# Patient Record
Sex: Male | Born: 1970 | Race: White | Hispanic: No | Marital: Married | Smoking: Never smoker
Health system: Southern US, Community
[De-identification: ages and names within clinical notes are randomized; demographics above are authoritative.]

## PROBLEM LIST (undated history)

## (undated) DIAGNOSIS — E7800 Pure hypercholesterolemia, unspecified: Secondary | ICD-10-CM

## (undated) DIAGNOSIS — E079 Disorder of thyroid, unspecified: Secondary | ICD-10-CM

## (undated) DIAGNOSIS — F419 Anxiety disorder, unspecified: Secondary | ICD-10-CM

## (undated) DIAGNOSIS — L80 Vitiligo: Secondary | ICD-10-CM

## (undated) DIAGNOSIS — D72819 Decreased white blood cell count, unspecified: Secondary | ICD-10-CM

## (undated) HISTORY — DX: Disorder of thyroid, unspecified: E07.9

## (undated) HISTORY — DX: Vitiligo: L80

## (undated) HISTORY — DX: Decreased white blood cell count, unspecified: D72.819

## (undated) HISTORY — DX: Pure hypercholesterolemia, unspecified: E78.00

## (undated) HISTORY — DX: Anxiety disorder, unspecified: F41.9

---

## 2002-06-19 ENCOUNTER — Ambulatory Visit (HOSPITAL_COMMUNITY): Admission: RE | Admit: 2002-06-19 | Discharge: 2002-06-19 | Payer: Self-pay | Admitting: Family Medicine

## 2002-06-19 ENCOUNTER — Encounter: Payer: Self-pay | Admitting: Family Medicine

## 2004-07-19 DIAGNOSIS — E7800 Pure hypercholesterolemia, unspecified: Secondary | ICD-10-CM

## 2004-07-19 HISTORY — DX: Pure hypercholesterolemia, unspecified: E78.00

## 2005-02-16 ENCOUNTER — Ambulatory Visit (HOSPITAL_COMMUNITY): Admission: RE | Admit: 2005-02-16 | Discharge: 2005-02-16 | Payer: Self-pay | Admitting: Family Medicine

## 2005-06-15 ENCOUNTER — Encounter: Admission: RE | Admit: 2005-06-15 | Discharge: 2005-09-13 | Payer: Self-pay | Admitting: Neurosurgery

## 2005-12-16 HISTORY — PX: BACK SURGERY: SHX140

## 2005-12-20 ENCOUNTER — Ambulatory Visit (HOSPITAL_COMMUNITY): Admission: RE | Admit: 2005-12-20 | Discharge: 2005-12-21 | Payer: Self-pay | Admitting: Neurosurgery

## 2005-12-20 IMAGING — CR DG LUMBAR SPINE 1V
1 series · 1 of 1 positions shown · non-contrast
Comparison: none

CLINICAL DATA: L5-S1 microdiscectomy. 
 LUMBAR SPINE - 1 VIEW:

[view not recorded]
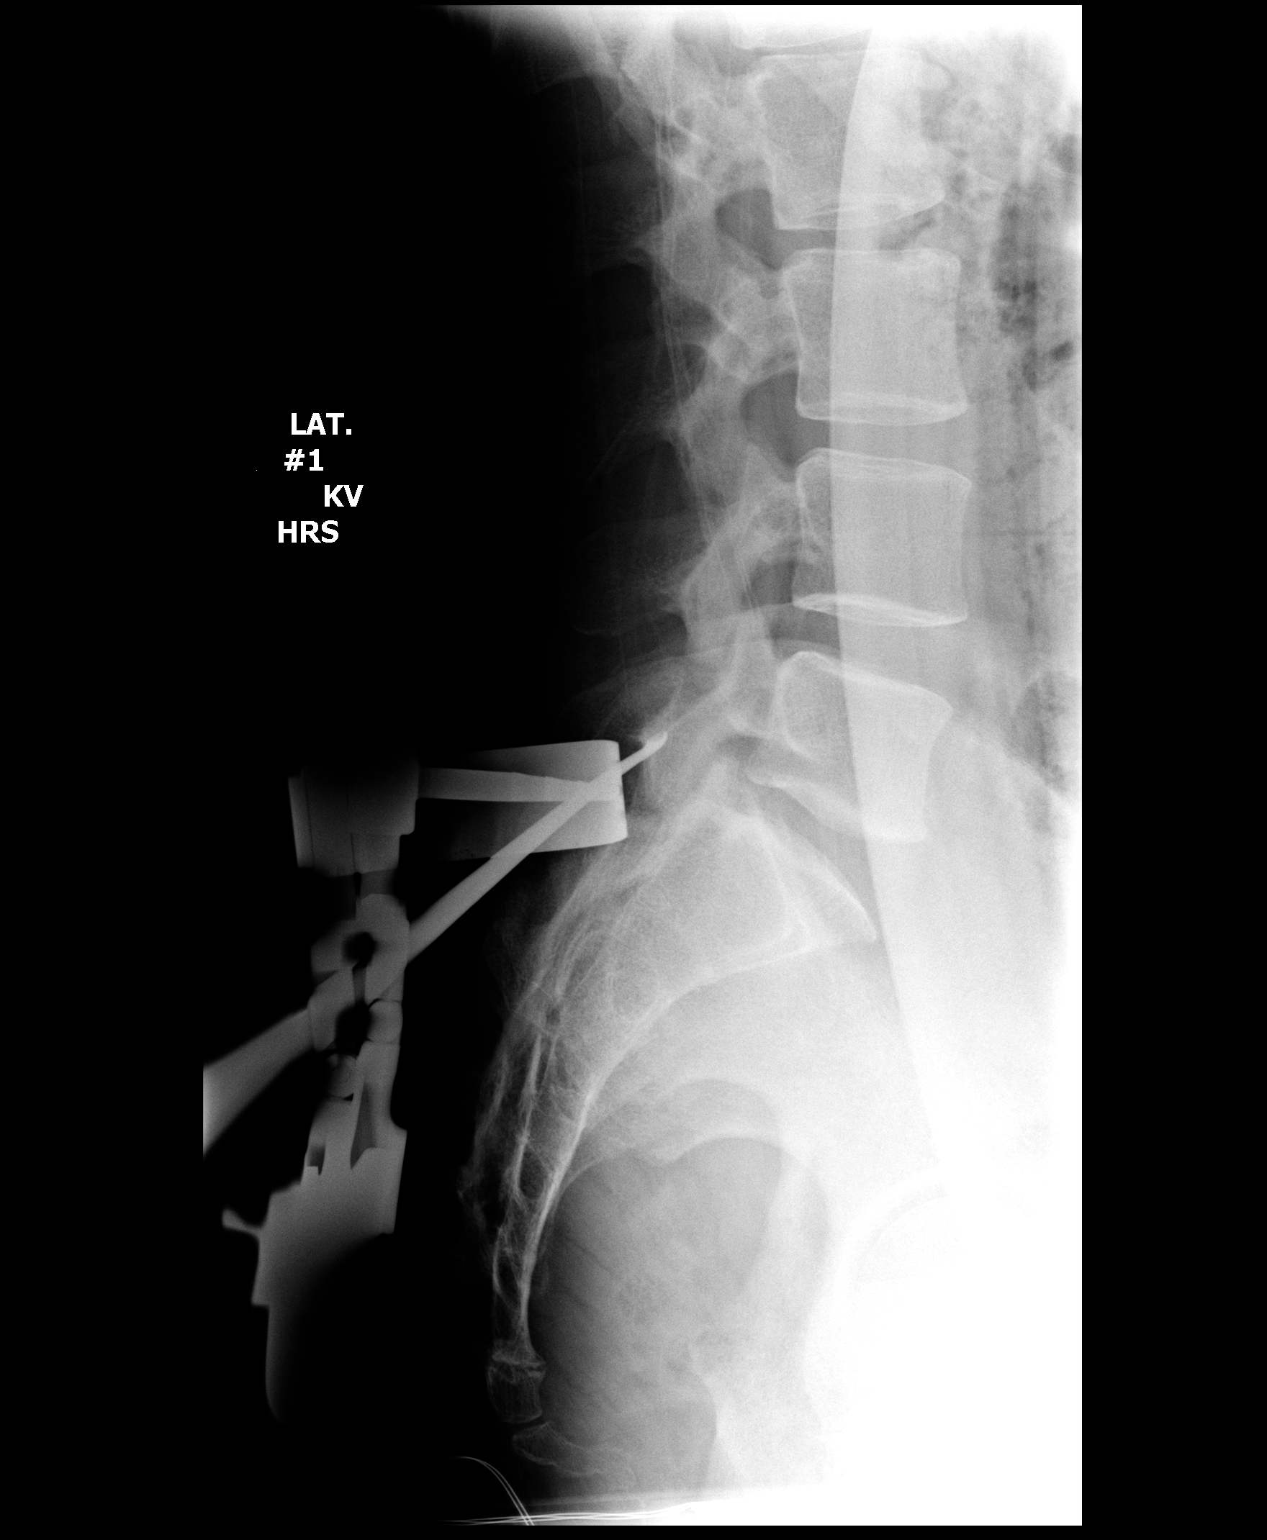

[1 of 1 positions shown; findings below may reference images not displayed]

FINDINGS: Cross table lateral view of the lumbar spine is correlated with radiographs done [DATE].  Surgical probes and retractors are noted overlying the posterior elements of the L5-S1 disc space level.
IMPRESSION: Intraoperative localization as described.

## 2007-07-01 ENCOUNTER — Ambulatory Visit (HOSPITAL_COMMUNITY): Admission: RE | Admit: 2007-07-01 | Discharge: 2007-07-01 | Payer: Self-pay | Admitting: Family Medicine

## 2010-11-03 NOTE — Op Note (Signed)
NAME:  Caisse, [place]              ACCOUNT NO.:  [id number]   MEDICAL RECORD NO.:  [id number]          PATIENT TYPE:  OIB   LOCATION:  3172                         FACILITY:  [facility]   PHYSICIAN:  Danae Orleans. Venetia Maxon, M.D.  DATE OF BIRTH:  June 20, 1970   DATE OF PROCEDURE:  12/20/2005  DATE OF DISCHARGE:                                 OPERATIVE REPORT   PREOPERATIVE DIAGNOSES:  Herniated lumbar disc, L5-S1, left, with  spondylosis, degenerative disc disease and radiculopathy.   POSTOPERATIVE DIAGNOSES:  Herniated lumbar disc, L5-S1, left, with  spondylosis, degenerative disc disease and radiculopathy.   PROCEDURE:  Left L5-S1 microdiscectomy with microdissection.   SURGEON:  Danae Orleans. Venetia Maxon, M.D.   ASSISTANT:  Cristi Loron, M.D.   ANESTHESIA:  General endotracheal anesthesia.   ESTIMATED BLOOD LOSS:  Minimal.   COMPLICATIONS:  None.   DISPOSITION:  Recovery.   INDICATIONS:  Theodore Browning is a 40 year old man with a large disc  herniation at the L5-S1 level on the left.  He has significant left S1  radiculopathy.  It was elected to take him to surgery for microdiscectomy.   DESCRIPTION OF PROCEDURE:  Mr. Kleinfelter was brought to the operating room.  Following satisfactory and uncomplicated induction of general endotracheal  anesthesia and placement of intravenous lines, the patient was placed in the  prone position on the operating table on the Wilson frame.  His low back was  then prepped and draped in the usual sterile fashion.  Area of planned  incision was infiltrated with 0.25% Marcaine, 0.5% lidocaine, 1:200,000  epinephrine.   Incision was made in the midline, carried through adipose tissue to the  lumbodorsal fascia, which was incised on the left side of the midline.  Subperiosteal dissection was performed, exposing the L5-S1 interspace on the  left side of midline.  A self-retaining retractor was placed.  Intraoperative x-ray confirmed correct orientation.  The  hemi-  semilaminectomy of L5 was then performed with the high-speed drill and  completed with Kerrison rongeurs.  The ligamentum flavum was detached and  removed in piecemeal fashion.  There was significant deflection of the S1  nerve root by herniated disc material, which was thinly covered by remaining  annular tissue.  With a little effort, a Penfield 4 was inserted into the  herniated disc material and multiple fragments of disc material were removed  with resultant significant decompression of the nerve roots.  Subsequently,  because the annular rent descended into the interspace, it was elected to  clear the interspace of residual loose disc material and this was done with  a variety of pituitary rongeurs and Epstein curets.  Subsequently, with  medial and lateral decompression, the interspace was felt to be well-  decompressed, as was the thecal sac and S1 nerve root.   Hemostasis was assured.  The wound was irrigated with Bacitracin and saline.  The operative bed was bathed in 80 mg Depo-Medrol 2 ml of fentanyl.  The  microscope was taken out of the field.  The lumbodorsal fascia was closed  with 0 Vicryl, subcutaneous tissues reapproximated with 2-0 Vicryl  interrupted inverted sutures and skin edges were reapproximated with  interrupted 3-0 Vicryl subcuticular stitch.  The wound was dressed with  Dermabond.  The patient was extubated in the operating room and taken to the  recovery room in stable satisfactory condition, having tolerated his  operation well.   Counts were correct at the end of the case.      Danae Orleans. Venetia Maxon, M.D.  Electronically Signed     JDS/MEDQ  D:  12/20/2005  T:  12/20/2005  Job:  [id number]

## 2012-11-20 ENCOUNTER — Ambulatory Visit (HOSPITAL_COMMUNITY): Payer: Self-pay

## 2012-11-21 ENCOUNTER — Encounter (HOSPITAL_COMMUNITY): Payer: Managed Care, Other (non HMO) | Attending: Hematology and Oncology

## 2012-11-21 ENCOUNTER — Encounter (HOSPITAL_COMMUNITY): Payer: Self-pay

## 2012-11-21 VITALS — BP 146/94 | HR 95 | Temp 98.1°F | Resp 16 | Ht 71.0 in | Wt 181.6 lb

## 2012-11-21 DIAGNOSIS — D72819 Decreased white blood cell count, unspecified: Secondary | ICD-10-CM | POA: Insufficient documentation

## 2012-11-21 DIAGNOSIS — Z8041 Family history of malignant neoplasm of ovary: Secondary | ICD-10-CM

## 2012-11-21 DIAGNOSIS — Z803 Family history of malignant neoplasm of breast: Secondary | ICD-10-CM

## 2012-11-21 DIAGNOSIS — D702 Other drug-induced agranulocytosis: Secondary | ICD-10-CM

## 2012-11-21 DIAGNOSIS — D709 Neutropenia, unspecified: Secondary | ICD-10-CM

## 2012-11-21 DIAGNOSIS — L80 Vitiligo: Secondary | ICD-10-CM

## 2012-11-21 LAB — COMPREHENSIVE METABOLIC PANEL
ALT: 12 U/L (ref 0–53)
AST: 21 U/L (ref 0–37)
Albumin: 5 g/dL (ref 3.5–5.2)
Calcium: 9.9 mg/dL (ref 8.4–10.5)
Creatinine, Ser: 1.09 mg/dL (ref 0.50–1.35)
Sodium: 141 mEq/L (ref 135–145)
Total Protein: 7.6 g/dL (ref 6.0–8.3)

## 2012-11-21 LAB — CBC WITH DIFFERENTIAL/PLATELET
Basophils Absolute: 0 10*3/uL (ref 0.0–0.1)
Basophils Relative: 0 % (ref 0–1)
Eosinophils Relative: 2 % (ref 0–5)
HCT: 44.4 % (ref 39.0–52.0)
Lymphocytes Relative: 23 % (ref 12–46)
MCHC: 34.9 g/dL (ref 30.0–36.0)
MCV: 84.4 fL (ref 78.0–100.0)
Monocytes Absolute: 0.3 10*3/uL (ref 0.1–1.0)
Platelets: 208 10*3/uL (ref 150–400)
RDW: 12.4 % (ref 11.5–15.5)
WBC: 3.8 10*3/uL — ABNORMAL LOW (ref 4.0–10.5)

## 2012-11-21 LAB — TSH: TSH: 3.626 u[IU]/mL (ref 0.350–4.500)

## 2012-11-21 LAB — VITAMIN B12: Vitamin B-12: 434 pg/mL (ref 211–911)

## 2012-11-21 NOTE — Progress Notes (Addendum)
Patient History and Physical   Theodore Browning [id number] [birth year] 42 y.o. 11/21/2012  Referring Physician: Jeannetta Ellis.  Chief Complaint: Decreased WBC.   HPI:  Dear Dr. Phillips Odor,  I had the pleasure of seeing Theodore Browning today, he was accompanied by his wife. Thank you for the opportunity to be part of his care. I have reviewed his records made available by your office. I  noted a cbc result dated  05/23/11, then WBC was 3.1 K./uL, ANC was 1.9K/uL, Is normal hemoglobin and platelet counts. More recently, CBC dated 10/28/2012 showed total WBC of 2.9 with absolute neutrophil count of 1.5. Her differential counts were normal and again hemoglobin and platelet counts were also normal.  On further questioning, patient did state that he has not been on any new medication, however he showed me topical tacrolimus 0.1% which he says he has  been using for at least the past couple of years for vitiligo . He is also on Vytorin for hyperlipidemia which he says is being on since 2006. He denies any unintended weight loss, no night sweats, fever or any significant peripheral lymphadenopathy. He also denies any significant resent, recurrent  or previous infection. He denies any blood problems to his knowledge. He also denies any family history of blood disease.He otherwise feels well.   PMH: Past Medical History  Diagnosis Date  . Vitiligo   . Hypercholesteremia 07/2004  . Anxiety     Past Surgical History  Procedure Laterality Date  . Back surgery  12/2005    L-5    Allergies: Allergies  Allergen Reactions  . Aspirin Swelling  . Shellfish Allergy Swelling    Medications: Current outpatient prescriptions:acetaminophen (TYLENOL) 325 MG tablet, Take 650 mg by mouth as needed for pain., Disp: , Rfl: ;  ezetimibe-simvastatin (VYTORIN) 10-20 MG per tablet, Take 1 tablet by mouth at bedtime. Take 1/4 tablet at bedtime, Disp: , Rfl: ;  tacrolimus (PROTOPIC) 0.1 % ointment, Apply topically 2  (two) times daily., Disp: , Rfl:    Social History:   reports that he has never smoked. He has never used smokeless tobacco. He reports that he does not drink alcohol or use illicit drugs. He works as an Audiological scientist.  He is married and lives with his wife.  He has a 42 year old male child.  Family History: Family History  Problem Relation Age of Onset  . Arthritis Father   . Heart disease Father   . Thyroid disease Brother   . Heart attack Maternal Uncle   . Diabetes Maternal Uncle   . Lupus Maternal Uncle   . Kidney disease Maternal Uncle   . Cancer Maternal Grandmother     breast cancer  . Heart attack Maternal Grandfather   . Stroke Maternal Grandfather   . Cancer Paternal Grandmother     colon cancer  . Diabetes Paternal Grandmother   . Diabetes Paternal Grandfather   . Heart attack Maternal Uncle   . Diabetes Maternal Uncle   . Heart attack Maternal Uncle   . Diabetes Maternal Uncle   . Heart attack Maternal Uncle   . Diabetes Maternal Uncle   . Cancer Maternal Aunt     ovarian cancer  . Diabetes Maternal Aunt   . Thyroid disease Maternal Aunt   . Heart disease Maternal Aunt   . Heart attack Paternal Uncle   . Diabetes Paternal Uncle   His mother and grandmother had breast cancer probably in her 30s/40s. Maternal Aunt had ovarian  cancer.  Patient is not aware of any family history BRCA1/2.  There is also history of heart disease on the maternal side of the family.  Review of Systems: 14 point review of system is as in the history above otherwise negative. Additionally hypopigmented patches of vitiligo   Physical Exam: Blood pressure 146/94, pulse 95, temperature 98.1 F (36.7 C), temperature source Oral, resp. rate 16, height 5\' 11"  (1.803 m), weight 181 lb 9.6 oz (82.373 kg). GENERAL: No distress, well nourished.  SKIN:  Hypopigmented patches mostly on the hands and face. HEAD: Normocephalic, No masses, lesions, tenderness or abnormalities  EYES:  Conjunctiva are pink and non-injected  ENT: External ears normal ,lips, buccal mucosa, and tongue normal and mucous membranes are moist .  No facial  tenderness. LYMPH: No palpable lymphadenopathy, in the neck, jaw area , supraclavicular ,axillary or inguinal areas. LUNGS: clear to auscultation , no crackles or wheezes HEART: regular rate & rhythm, no murmurs, no gallops, S1 normal and S2 normal  ABDOMEN: Abdomen soft, non-tender, normal bowel sounds, no masses or organomegaly and no hepatosplenomegaly palpable. EXTREMITIES: No edema, no skin discoloration or tenderness NEURO: alert & oriented , no focal motor/sensory deficits.     Lab Results: No results found for this basename: WBC, HGB, HCT, MCV, PLT     Chemistry   No results found for this basename: NA, K, CL, CO2, BUN, CREATININE, GLU   No results found for this basename: CALCIUM, ALKPHOS, AST, ALT, BILITOT     Pending.    Radiological Studies: No results found.    Impression: Theodore Browning,  based on his records available to me at this time has mild neutropenia.  I suspect that this is most likely due to tacrolimus.  He tells me that he does derive clinical cosmetic benefit from this and has not had any serious or significant past infection.  Most recent labs done in May 2014 show absolute neutrophil count of 1.5 K./uL which does not put him at any significant risk of infection.  However I would like to know what the level is presently. We discussed the differential diagnosis of leukopenia/neutropenia extensively.  He has vitiligo which is essentially a surrogate of autoimmune diseases.  This can sometimes be associated with pernicious anemia and Graves disease among others.  I think that it will be reasonable to obtain a  Vit B12 even though I do not feel that this is contributing to his neutropenia.   As regards his maternal family history of early breast and ovarian cancer, I instructed him to him to inquire more about family  history with regard to cancer associated with BRCA1 and 2 which we discussed including prostate cancer in males .   Recommendations: 1.  I ordered a CBC, CMP, LDH and  Vit B12 today.  I will also review peripheral blood smear. 2.  He will return to clinic in 2 weeks to discuss results.  3.  I reassured him that based on CBC done in May and there does not seem to be any cause for alarm. 4.  In the event that no other abnormal findings are made,it will be reasonable to have patient continue topical tacrolimus as long as his CBCs monitored closely probably every 3-6 months and his neutrophil count is above 500 or 0.5 K./UL. 5.  Definitive recommendation would be at his return visit in 2 weeks.  All questions were satisfactorily answered. He knows to call if he has any concern.  I spent 30  minutes counseling the patient face to face. The total time spent in the appointment was 60 minutes.   Sherral Hammers, MD FACP. Hematology/Oncology.  Addendum: Review of peripheral blood smear showed a few band forms, mature segmented neutrophils, reactive lymphocytes, giant platelets, normal chromic and normocytic red blood cells otherwise unremarkable.

## 2012-11-21 NOTE — Patient Instructions (Addendum)
[facility] Discharge Instructions  RECOMMENDATIONS MADE BY THE CONSULTANT AND ANY TEST RESULTS WILL BE SENT TO YOUR REFERRING PHYSICIAN.  Based on the information we have, your white blood cells are not critically low. The doctor does not think you are at any risk of increased susceptibility to infections. The Protopic cream you use may be a contributing factor to the decreased white blood cells. Your white blood cell count may be normal for you. Your Vitiligo is associated with auto-immune disease and it may also be a contributing factor to the cell count. We will do additional lab work today. We will have you come back in 1 week to discuss results. If your white blood cell count is the same as it has been, we will recommend periodic follow-up/lab work with either [place] or your primary care physician.  Thank you for choosing [facility] to provide your oncology and hematology care.  To afford each patient quality time with our providers, please arrive at least 15 minutes before your scheduled appointment time.  With your help, our goal is to use those 15 minutes to complete the necessary work-up to ensure our physicians have the information they need to help with your evaluation and healthcare recommendations.    Effective January 1st, 2014, we ask that you re-schedule your appointment with our physicians should you arrive 10 or more minutes late for your appointment.  We strive to give you quality time with our providers, and arriving late affects you and other patients whose appointments are after yours.    Again, thank you for choosing [facility].  Our hope is that these requests will decrease the amount of time that you wait before being seen by our physicians.       _____________________________________________________________  Should you have questions after your visit to [facility], please contact our office at [phone]  between the hours of 8:30 a.m. and 5:00 p.m.  Voicemails left after 4:30 p.m. will not be returned until the following business day.  For prescription refill requests, have your pharmacy contact our office with your prescription refill request.

## 2012-11-25 LAB — THYROID STIMULATING IMMUNOGLOBULIN: TSI: 52 % baseline (ref <140)

## 2012-12-03 ENCOUNTER — Encounter (HOSPITAL_COMMUNITY): Payer: Self-pay | Admitting: Oncology

## 2012-12-03 DIAGNOSIS — D704 Cyclic neutropenia: Secondary | ICD-10-CM | POA: Insufficient documentation

## 2012-12-03 DIAGNOSIS — L80 Vitiligo: Secondary | ICD-10-CM

## 2012-12-03 DIAGNOSIS — D72819 Decreased white blood cell count, unspecified: Secondary | ICD-10-CM

## 2012-12-03 HISTORY — DX: Decreased white blood cell count, unspecified: D72.819

## 2012-12-03 HISTORY — DX: Vitiligo: L80

## 2012-12-03 NOTE — Progress Notes (Signed)
Theodore Ribas, MD [address] [place] [address]  Leukopenia - Plan: CBC with Differential  Vitiligo - Plan: CBC with Differential  CURRENT THERAPY: Surveillance  INTERVAL HISTORY: Theodore Browning 42 y.o. male returns for  regular  visit for followup of leukopenia with predominance of neutropenia.  I personally reviewed and went over laboratory results with the patient.  Lab work performed 2 weeks ago in the clinic demonstrates Browning leukopenia with Browning normal differential.  LDH, Vit B12, TSH, and TSI are all WNL.    I provided Theodore Browning education regarding his vitiligo and the pathogenesis of this disease is thought to involve an autoimmune process directed against melanocytes.  As Browning result, this is possibly playing Browning role in his leukopenia.  Topical tacrolimus may be playing Browning part in this leukopenia as well per Dr. Sharia Browning, despite this not being listed Browning common side effect of the medication.   Per Dr. Sharia Browning, "Review of peripheral blood smear showed Browning few band forms, mature segmented neutrophils, reactive lymphocytes, giant platelets, normal chromic and normocytic red blood cells otherwise unremarkable."  The patient denies any recurrent infections. He says he cannot remember the last time he was sick. He is accompanied today by his wife who reports that the last time she remembers him being sick was nearly 10 years ago.  He cannot remember the last time he required an antibiotic. Of course, this is promising because it tells [place] that his white blood cells are functional and doing their job.  He has contact dermatitis on its arms bilaterally. He is utilizing hydrocortisone cream symptomatically. I've encouraged him to continue to do so. He reports that he ascertained at this after clearing some shrubery from his property. Certainly is not systemic  therefore an oral steroid maybe Browning little too aggressive at this point time.  He was encouraged to follow  his primary care physician if symptoms worsen or fail to improve quickly.  He asks for some education regarding vitiligo and I printed him some information from up to date that he go home with. Some of this information is pretty intense and scientific and he may not understand some of it, nonetheless, I gave him Browning copy of this information.  Hematologically, the patient denies any complaints and ROS questioning is negative. B. symptomatology is also negative   Past Medical History  Diagnosis Date  . Vitiligo   . Hypercholesteremia 07/2004  . Anxiety   . 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 Vitiligo 12/03/2012    has Leukopenia and Vitiligo on his problem list.     is allergic to aspirin and shellfish allergy.  Theodore Browning does not currently have medications on file.  Past Surgical History  Procedure Laterality Date  . Back surgery  12/2005    L-5    Denies any headaches, dizziness, double vision, fevers, chills, night sweats, nausea, vomiting, diarrhea, constipation, chest pain, heart palpitations, shortness of breath, blood in stool, black tarry stool, urinary pain, urinary burning, urinary frequency, hematuria.   PHYSICAL EXAMINATION  ECOG PERFORMANCE STATUS: 0 - Asymptomatic  Filed Vitals:   12/04/12 0930  BP: 157/91  Pulse: 99  Temp: 98.5 F (36.9 C)  Resp: 18    GENERAL:alert, no distress, well nourished, well developed, comfortable, cooperative and smiling SKIN: skin color, texture, turgor are normal, erythematous rash on the anterior aspects of forearms bilaterally and medial to olecranon in Browning pattern  consistent with contact dermatitis. HEAD: Normocephalic, No masses, lesions, tenderness or abnormalities EYES: normal, PERRLA, EOMI, Conjunctiva are pink and non-injected EARS: External ears normal OROPHARYNX:mucous membranes are moist  NECK: supple, no adenopathy, trachea midline LYMPH:  no palpable lymphadenopathy, no  hepatosplenomegaly BREAST:not examined LUNGS: clear to auscultation and percussion HEART: regular rate & rhythm, no murmurs, no gallops, S1 normal and S2 normal ABDOMEN:abdomen soft, non-tender, normal bowel sounds, no masses or organomegaly and no hepatosplenomegaly BACK: Back symmetric, no curvature., No CVA tenderness EXTREMITIES:less then 2 second capillary refill, no joint deformities, effusion, or inflammation, no edema, no skin discoloration, no clubbing, no cyanosis, positive findings:  vitiligo on bilateral posterior aspect/dorsal aspect of hands.  NEURO: alert & oriented x 3 with fluent speech, no focal motor/sensory deficits, gait normal   LABORATORY DATA: CBC    Component Value Date/Time   WBC 3.8* 11/21/2012 1011   RBC 5.26 11/21/2012 1011   HGB 15.5 11/21/2012 1011   HCT 44.4 11/21/2012 1011   PLT 208 11/21/2012 1011   MCV 84.4 11/21/2012 1011   MCH 29.5 11/21/2012 1011   MCHC 34.9 11/21/2012 1011   RDW 12.4 11/21/2012 1011   LYMPHSABS 0.9 11/21/2012 1011   MONOABS 0.3 11/21/2012 1011   EOSABS 0.1 11/21/2012 1011   BASOSABS 0.0 11/21/2012 1011      Chemistry      Component Value Date/Time   NA 141 11/21/2012 1011   K 3.8 11/21/2012 1011   CL 101 11/21/2012 1011   CO2 28 11/21/2012 1011   BUN 23 11/21/2012 1011   CREATININE 1.09 11/21/2012 1011      Component Value Date/Time   CALCIUM 9.9 11/21/2012 1011   ALKPHOS 64 11/21/2012 1011   AST 21 11/21/2012 1011   ALT 12 11/21/2012 1011   BILITOT 0.5 11/21/2012 1011     Results for Theodore Browning (MRN [id number]) as of 12/03/2012 09:22  Ref. Range 11/21/2012 10:02  TSI Latest Range: <140 % baseline 52   Results for Theodore Browning (MRN [id number]) as of 12/03/2012 09:22  Ref. Range 11/21/2012 10:11  TSH Latest Range: 0.350-4.500 uIU/mL 3.626   Results for Theodore Browning (MRN [id number]) as of 12/03/2012 09:22  Ref. Range 11/21/2012 10:11  Vitamin B-12 Latest Range: 211-911 pg/mL 434   Results for Theodore Browning (MRN [id number]) as of 12/03/2012  09:22  Ref. Range 11/21/2012 10:11  LDH Latest Range: 94-250 U/L 162      ASSESSMENT:  1. Leukopenia with Browning predominance of neutropenia.  He has not required any antibiotics in some time. 2. Vitiligo, taking topical tacrolimus 3. Contact dermatitis  Patient Active Problem List   Diagnosis Date Noted  . Leukopenia 12/03/2012  . Vitiligo 12/03/2012     PLAN:  1. Patient education regarding his leukopenia 2. I personally reviewed and went over laboratory results with the patient. 3. I reviewed the results of his peripheral blood smear reviewed by Dr. Sharia Browning 4. Patient education regarding vitiligo 5. Patient education regarding contact dermatitis. 6. Continue OTC symptoma management of contact dermatitis. 7. Follow-up wirh PCP if contact dermatitis does not improve in the next few weeks.  8. Labs in 4 months: CBC diff.   9. Rx for these labs to be performed at [facility] 10. Return in 4 months   THERAPY PLAN:  There is no intervention at this time other than observation.  The present thought process is that his leukopenia is secondary to tacrolimus topical, vitiligo, or hereditary.  He will continue  with current medication per PCP and at this time there is not Browning need to change any medications.  He has not required any antibiotics recently and has not admitted to recurrent infections.  Therefore his WBCs are active and functional.  I suspect we'll follow his blood counts over the next year to prove stability of his white blood cell count, and leukopenia. Once we are able to establish stability, we will release him from the clinic  All questions were answered. The patient knows to call the clinic with any problems, questions or concerns. We can certainly see the patient much sooner if necessary.  Patient and plan will be discussed with Dr. Mariel Sleet within the next 24 hours.    Tenesha Garza

## 2012-12-04 ENCOUNTER — Encounter (HOSPITAL_COMMUNITY): Payer: Self-pay | Admitting: Oncology

## 2012-12-04 ENCOUNTER — Encounter (HOSPITAL_BASED_OUTPATIENT_CLINIC_OR_DEPARTMENT_OTHER): Payer: Managed Care, Other (non HMO) | Admitting: Oncology

## 2012-12-04 VITALS — BP 157/91 | HR 99 | Temp 98.5°F | Resp 18 | Wt 183.4 lb

## 2012-12-04 DIAGNOSIS — L80 Vitiligo: Secondary | ICD-10-CM

## 2012-12-04 DIAGNOSIS — D72819 Decreased white blood cell count, unspecified: Secondary | ICD-10-CM

## 2012-12-04 NOTE — Patient Instructions (Addendum)
[facility] Discharge Instructions  RECOMMENDATIONS MADE BY THE CONSULTANT AND ANY TEST RESULTS WILL BE SENT TO YOUR REFERRING PHYSICIAN.  EXAM FINDINGS BY THE PHYSICIAN TODAY AND SIGNS OR SYMPTOMS TO REPORT TO CLINIC OR PRIMARY PHYSICIAN: Exam findings as discussed by T. Kefalas, PA-C.  SPECIAL INSTRUCTIONS/FOLLOW-UP: 1.  Please keep your scheduled follow-up appt in 4 months as scheduled.  You will need to have labs done as scheduled prior to seeing Dr. Mariel Sleet. 2.  You may go to [facility] [phone]) to have your labs drawn.  Thank you for choosing [facility] to provide your oncology and hematology care.  To afford each patient quality time with our providers, please arrive at least 15 minutes before your scheduled appointment time.  With your help, our goal is to use those 15 minutes to complete the necessary work-up to ensure our physicians have the information they need to help with your evaluation and healthcare recommendations.    Effective January 1st, 2014, we ask that you re-schedule your appointment with our physicians should you arrive 10 or more minutes late for your appointment.  We strive to give you quality time with our providers, and arriving late affects you and other patients whose appointments are after yours.    Again, thank you for choosing [facility].  Our hope is that these requests will decrease the amount of time that you wait before being seen by our physicians.       _____________________________________________________________  Should you have questions after your visit to [facility], please contact our office at [phone] between the hours of 8:30 a.m. and 5:00 p.m.  Voicemails left after 4:30 p.m. will not be returned until the following business day.  For prescription refill requests, have your pharmacy contact our office with your prescription refill request.

## 2013-04-07 ENCOUNTER — Ambulatory Visit (HOSPITAL_COMMUNITY): Payer: Managed Care, Other (non HMO)

## 2013-04-08 NOTE — Progress Notes (Signed)
This encounter was created in error - please disregard.

## 2021-07-13 ENCOUNTER — Encounter: Payer: Self-pay | Admitting: Gastroenterology

## 2021-08-03 ENCOUNTER — Other Ambulatory Visit: Payer: Self-pay

## 2021-08-03 ENCOUNTER — Ambulatory Visit (AMBULATORY_SURGERY_CENTER): Payer: Self-pay | Admitting: *Deleted

## 2021-08-03 VITALS — Ht 71.0 in | Wt 185.0 lb

## 2021-08-03 DIAGNOSIS — Z1211 Encounter for screening for malignant neoplasm of colon: Secondary | ICD-10-CM

## 2021-08-03 MED ORDER — NA SULFATE-K SULFATE-MG SULF 17.5-3.13-1.6 GM/177ML PO SOLN: 1.0000 | Freq: Once | ORAL | 0 refills | Status: AC

## 2021-08-03 NOTE — Progress Notes (Signed)
Pt's previsit is done over the phone and all paperwork (prep instructions, blank consent form to just read over) sent to patient.  Pt's name and DOB verified at the beginning of the previsit.  Pt denies any difficulty with ambulating.    No trouble with anesthesia, denies being told they were difficult to intubate, or hx/fam hx of malignant hyperthermia per pt   No egg or soy allergy  No home oxygen use   No medications for weight loss taken  emmi information given  Pt denies constipation issues  Pt informed that we do not do prior authorizations for prep

## 2021-08-17 ENCOUNTER — Encounter: Payer: Self-pay | Admitting: Gastroenterology

## 2021-08-18 ENCOUNTER — Ambulatory Visit (AMBULATORY_SURGERY_CENTER): Payer: PRIVATE HEALTH INSURANCE | Admitting: Gastroenterology

## 2021-08-18 ENCOUNTER — Other Ambulatory Visit: Payer: Self-pay

## 2021-08-18 ENCOUNTER — Encounter: Payer: Self-pay | Admitting: Gastroenterology

## 2021-08-18 VITALS — BP 137/80 | HR 57 | Resp 12 | Ht 71.0 in | Wt 185.0 lb

## 2021-08-18 DIAGNOSIS — Z1211 Encounter for screening for malignant neoplasm of colon: Secondary | ICD-10-CM

## 2021-08-18 DIAGNOSIS — D124 Benign neoplasm of descending colon: Secondary | ICD-10-CM | POA: Diagnosis not present

## 2021-08-18 MED ORDER — SODIUM CHLORIDE 0.9 % IV SOLN: 500.0000 mL | INTRAVENOUS | Status: DC

## 2021-08-18 NOTE — Progress Notes (Signed)
[facility] History and Physical ? ? ?Primary Care Physician:  Sharilyn Sites, MD ? ? ?Reason for Procedure:   Colon cancer screening ? ?Plan:    Screening colonoscopy ? ? ? ? ?HPI: Theodore Browning is a 51 y.o. male undergoing initial average risk screening colonoscopy.  He has no family history of colon cancer (other htan paternal grandmother) and no chronic GI symptoms.  ? ? ?Past Medical History:  ?Diagnosis Date  ? Anxiety   ? pt denies  ? Hypercholesteremia 07/19/2004  ? Leukopenia 12/03/2012  ? With neutropenia predominance  ? Thyroid disease   ? hypothyroid  ? Vitiligo   ? Vitiligo 12/03/2012  ? ? ?Past Surgical History:  ?Procedure Laterality Date  ? BACK SURGERY  12/2005  ? L-5  ? ? ?Prior to Admission medications   ?Medication Sig Start Date End Date Taking? Authorizing Provider  ?levothyroxine (SYNTHROID) 88 MCG tablet Take 88 mcg by mouth daily. 05/17/21  Yes [provider]  ?pravastatin (PRAVACHOL) 20 MG tablet Take 20 mg by mouth daily. 07/31/21  Yes [provider]  ?triamcinolone lotion (KENALOG) 0.1 % Apply 1 application topically 3 (three) times daily. Uses PRN    [provider]  ? ? ?Current Outpatient Medications  ?Medication Sig Dispense Refill  ? levothyroxine (SYNTHROID) 88 MCG tablet Take 88 mcg by mouth daily.    ? pravastatin (PRAVACHOL) 20 MG tablet Take 20 mg by mouth daily.    ? triamcinolone lotion (KENALOG) 0.1 % Apply 1 application topically 3 (three) times daily. Uses PRN    ? ?Current Facility-Administered Medications  ?Medication Dose Route Frequency Provider Last Rate Last Admin  ? 0.9 %  sodium chloride infusion  500 mL Intravenous Continuous Theodore November, MD      ? ? ?Allergies as of 08/18/2021 - Review Complete 08/18/2021  ?Allergen Reaction Noted  ? Aspirin Swelling 11/21/2012  ? Shellfish allergy Swelling 11/21/2012  ? ? ?Family History  ?Problem Relation Age of Onset  ? Colon polyps Father   ? Arthritis Father   ? Heart disease  Father   ? Diverticulitis Father   ?     had partial colectomy d/t perforation  ? Thyroid disease Brother   ? Cancer Maternal Aunt   ?     ovarian cancer  ? Diabetes Maternal Aunt   ? Thyroid disease Maternal Aunt   ? Heart disease Maternal Aunt   ? Heart attack Maternal Uncle   ? Diabetes Maternal Uncle   ? Lupus Maternal Uncle   ? Kidney disease Maternal Uncle   ? Heart attack Maternal Uncle   ? Diabetes Maternal Uncle   ? Heart attack Maternal Uncle   ? Diabetes Maternal Uncle   ? Heart attack Maternal Uncle   ? Diabetes Maternal Uncle   ? Heart attack Paternal Uncle   ? Diabetes Paternal Uncle   ? Cancer Maternal Grandmother   ?     breast cancer  ? Heart attack Maternal Grandfather   ? Stroke Maternal Grandfather   ? Colon cancer Paternal Grandmother   ? Cancer Paternal Grandmother   ?     colon cancer  ? Diabetes Paternal Grandmother   ? Diabetes Paternal Grandfather   ? Esophageal cancer Neg Hx   ? Stomach cancer Neg Hx   ? Rectal cancer Neg Hx   ? ? ?Social History  ? ?Socioeconomic History  ? Marital status: Married  ?  Spouse name: Not on file  ? Number  of children: Not on file  ? Years of education: Not on file  ? Highest education level: Not on file  ?Occupational History  ? Not on file  ?Tobacco Use  ? Smoking status: Never  ? Smokeless tobacco: Never  ?Vaping Use  ? Vaping Use: Never used  ?Substance and Sexual Activity  ? Alcohol use: No  ? Drug use: No  ? Sexual activity: Yes  ?Other Topics Concern  ? Not on file  ?Social History Narrative  ? Not on file  ? ?Social Determinants of Health  ? ?Financial Resource Strain: Not on file  ?Food Insecurity: Not on file  ?Transportation Needs: Not on file  ?Physical Activity: Not on file  ?Stress: Not on file  ?Social Connections: Not on file  ?Intimate Partner Violence: Not on file  ? ? ?Review of Systems: ? ?All other review of systems negative except as mentioned in the HPI. ? ?Physical Exam: ?Vital signs ?BP (!) 172/97   Pulse 100   Ht 5\' 11"  (1.803 m)    Wt 185 lb (83.9 kg)   SpO2 100%   BMI 25.80 kg/m?  ? ?General:   Alert,  Well-developed, well-nourished, pleasant and cooperative in NAD ?Airway:  Mallampati 2 ?Lungs:  Clear throughout to auscultation.   ?Heart:  Regular rate and rhythm; no murmurs, clicks, rubs,  or gallops. ?Abdomen:  Soft, nontender and nondistended. Normal bowel sounds.   ?Neuro/Psych:  Normal mood and affect. A and O x 3 ? ? ?Theodore Browning E. Candis Schatz, MD ?[facility] ? ?

## 2021-08-18 NOTE — Patient Instructions (Signed)
Read you discharge instructions.  We will be in touch by letter. ? ?YOU HAD AN ENDOSCOPIC PROCEDURE TODAY AT [facility]:   Refer to the procedure report that was given to you for any specific questions about what was found during the examination.  If the procedure report does not answer your questions, please call your gastroenterologist to clarify.  If you requested that your care partner not be given the details of your procedure findings, then the procedure report has been included in a sealed envelope for you to review at your convenience later. ? ?YOU SHOULD EXPECT: Some feelings of bloating in the abdomen. Passage of more gas than usual.  Walking can help get rid of the air that was put into your GI tract during the procedure and reduce the bloating. If you had a lower endoscopy (such as a colonoscopy or flexible sigmoidoscopy) you may notice spotting of blood in your stool or on the toilet paper. If you underwent a bowel prep for your procedure, you may not have a normal bowel movement for a few days. ? ?Please Note:  You might notice some irritation and congestion in your nose or some drainage.  This is from the oxygen used during your procedure.  There is no need for concern and it should clear up in a day or so. ? ?SYMPTOMS TO REPORT IMMEDIATELY: ? ?Following lower endoscopy (colonoscopy or flexible sigmoidoscopy): ? Excessive amounts of blood in the stool ? Significant tenderness or worsening of abdominal pains ? Swelling of the abdomen that is new, acute ? Fever of 100?F or higher ? ? ?For urgent or emergent issues, a gastroenterologist can be reached at any hour by calling [phone]. ?Do not use MyChart messaging for urgent concerns.  ? ? ?DIET:  We do recommend a small meal at first, but then you may proceed to your regular diet.  Drink plenty of fluids but you should avoid alcoholic beverages for 24 hours. ? ?ACTIVITY:  You should plan to take it easy for the rest of today and  you should NOT DRIVE or use heavy machinery until tomorrow (because of the sedation medicines used during the test).   ? ?FOLLOW UP: ?Our staff will call the number listed on your records 48-72 hours following your procedure to check on you and address any questions or concerns that you may have regarding the information given to you following your procedure. If we do not reach you, we will leave a message.  We will attempt to reach you two times.  During this call, we will ask if you have developed any symptoms of COVID 19. If you develop any symptoms (ie: fever, flu-like symptoms, shortness of breath, cough etc.) before then, please call [phone].  If you test positive for Covid 19 in the 2 weeks post procedure, please call and report this information to [place].   ? ?If any biopsies were taken you will be contacted by phone or by letter within the next 1-3 weeks.  Please call us at [phone] if you have not heard about the biopsies in 3 weeks.  ? ? ?SIGNATURES/CONFIDENTIALITY: ?You and/or your care partner have signed paperwork which will be entered into your electronic medical record.  These signatures attest to the fact that that the information above on your After Visit Summary has been reviewed and is understood.  Full responsibility of the confidentiality of this discharge information lies with you and/or your care-partner.  ?

## 2021-08-18 NOTE — Progress Notes (Signed)
Report to PACU, RN, vss, BBS= Clear.  

## 2021-08-18 NOTE — Progress Notes (Signed)
Pt's states no medical or surgical changes since previsit or office visit. 

## 2021-08-18 NOTE — Op Note (Signed)
[facility] ?Patient Name: Theodore Browning ?Procedure Date: 08/18/2021 8:18 AM ?MRN: [id number] ?Endoscopist: Heriberto Stmartin E. Candis Schatz , MD ?Age: 51 ?Referring MD:  ?Date of Birth: September 29, 1970 ?Gender: Male ?Account #: [id number] ?Procedure:                Colonoscopy ?Indications:              Screening for colorectal malignant neoplasm, This  ?                          is the patient's first colonoscopy ?Medicines:                Monitored Anesthesia Care ?Procedure:                Pre-Anesthesia Assessment: ?                          - Prior to the procedure, a History and Physical  ?                          was performed, and patient medications and  ?                          allergies were reviewed. The patient's tolerance of  ?                          previous anesthesia was also reviewed. The risks  ?                          and benefits of the procedure and the sedation  ?                          options and risks were discussed with the patient.  ?                          All questions were answered, and informed consent  ?                          was obtained. Prior Anticoagulants: The patient has  ?                          taken no previous anticoagulant or antiplatelet  ?                          agents. ASA Grade Assessment: II - A patient with  ?                          mild systemic disease. After reviewing the risks  ?                          and benefits, the patient was deemed in  ?                          satisfactory condition to undergo the procedure. ?  After obtaining informed consent, the colonoscope  ?                          was passed under direct vision. Throughout the  ?                          procedure, the patient's blood pressure, pulse, and  ?                          oxygen saturations were monitored continuously. The  ?                          Colonoscope was introduced through the anus and  ?                          advanced to the the  terminal ileum, with  ?                          identification of the appendiceal orifice and IC  ?                          valve. The colonoscopy was performed without  ?                          difficulty. The patient tolerated the procedure  ?                          well. The quality of the bowel preparation was  ?                          adequate. The terminal ileum, ileocecal valve,  ?                          appendiceal orifice, and rectum were photographed.  ?                          The bowel preparation used was SUPREP via split  ?                          dose instruction. ?Scope In: 8:24:40 AM ?Scope Out: 8:41:44 AM ?Scope Withdrawal Time: 0 hours 11 minutes 21 seconds  ?Total Procedure Duration: 0 hours 17 minutes 4 seconds  ?Findings:                 The perianal and digital rectal examinations were  ?                          normal. Pertinent negatives include normal  ?                          sphincter tone and no palpable rectal lesions. ?                          A 5 mm polyp was found in the descending colon. The  ?  polyp was flat. The polyp was removed with a cold  ?                          snare. Resection and retrieval were complete.  ?                          Estimated blood loss was minimal. ?                          The exam was otherwise normal throughout the  ?                          examined colon. ?                          The terminal ileum appeared normal. ?                          The retroflexed view of the distal rectum and anal  ?                          verge was normal and showed no anal or rectal  ?                          abnormalities. ?Complications:            No immediate complications. ?Estimated Blood Loss:     Estimated blood loss was minimal. ?Impression:               - One 5 mm polyp in the descending colon, removed  ?                          with a cold snare. Resected and retrieved. ?                          - The  examined portion of the ileum was normal. ?                          - The distal rectum and anal verge are normal on  ?                          retroflexion view. ?Recommendation:           - Patient has a contact number available for  ?                          emergencies. The signs and symptoms of potential  ?                          delayed complications were discussed with the  ?                          patient. Return to normal activities tomorrow.  ?  Written discharge instructions were provided to the  ?                          patient. ?                          - Resume previous diet. ?                          - Continue present medications. ?                          - Await pathology results. ?                          - Repeat colonoscopy (date not yet determined) for  ?                          surveillance based on pathology results. ?Perseus Westall E. Candis Schatz, MD ?08/18/2021 8:47:34 AM ?This report has been signed electronically. ?

## 2021-08-18 NOTE — Progress Notes (Signed)
Called to room to assist during endoscopic procedure.  Patient ID and intended procedure confirmed with present staff. Received instructions for my participation in the procedure from the performing physician.  

## 2021-08-22 ENCOUNTER — Telehealth: Payer: Self-pay

## 2021-08-22 NOTE — Telephone Encounter (Signed)
?  Follow up Call- ? ?Call back number 08/18/2021  ?Post procedure Call Back phone  # [phone]  ?Permission to leave phone message Yes  ?Some recent data might be hidden  ?  ? ?Patient questions: ? ?Do you have a fever, pain , or abdominal swelling? No. ?Pain Score  0 * ? ?Have you tolerated food without any problems? Yes.   ? ?Have you been able to return to your normal activities? Yes.   ? ?Do you have any questions about your discharge instructions: ?Diet   No. ?Medications  No. ?Follow up visit  No. ? ?Do you have questions or concerns about your Care? No. ? ?Actions: ?* If pain score is 4 or above: ?No action needed, pain <4. ? ? ?

## 2021-08-24 NOTE — Progress Notes (Signed)
Theodore Browning,  ?The polyp which I removed during your recent procedure was proven to be completely benign but is considered a "pre-cancerous" polyp that MAY have grown into cancer if it had not been removed.  Studies shows that at least 20% of women over age 51 and 30% of men over age 65 have pre-cancerous polyps.  Based on current nationally recognized surveillance guidelines, I recommend that you have a repeat colonoscopy in 7 years.  ? ?If you develop any new rectal bleeding, abdominal pain or significant bowel habit changes, please contact me before then. ? ?

## 2023-04-22 ENCOUNTER — Other Ambulatory Visit: Payer: Self-pay | Admitting: Family Medicine

## 2023-04-22 DIAGNOSIS — Z0001 Encounter for general adult medical examination with abnormal findings: Secondary | ICD-10-CM

## 2023-05-07 ENCOUNTER — Inpatient Hospital Stay
Admission: RE | Admit: 2023-05-07 | Discharge: 2023-05-07 | Discharge status: Home / Self Care | Payer: Self-pay | Source: Ambulatory Visit | Attending: Family Medicine | Admitting: Family Medicine

## 2023-05-07 DIAGNOSIS — Z0001 Encounter for general adult medical examination with abnormal findings: Secondary | ICD-10-CM

## 2023-05-22 ENCOUNTER — Other Ambulatory Visit (HOSPITAL_COMMUNITY): Payer: Self-pay | Admitting: Family Medicine

## 2023-05-22 DIAGNOSIS — R221 Localized swelling, mass and lump, neck: Secondary | ICD-10-CM

## 2023-05-31 ENCOUNTER — Ambulatory Visit (HOSPITAL_COMMUNITY)
Admission: RE | Admit: 2023-05-31 | Discharge: 2023-05-31 | Disposition: A | Discharge status: Home / Self Care | Payer: PRIVATE HEALTH INSURANCE | Source: Ambulatory Visit | Attending: Family Medicine | Admitting: Family Medicine

## 2023-05-31 DIAGNOSIS — R221 Localized swelling, mass and lump, neck: Secondary | ICD-10-CM | POA: Insufficient documentation
